# Patient Record
Sex: Female | Born: 1965 | Race: Black or African American | Hispanic: No | Marital: Single
Health system: Southern US, Community
[De-identification: ages and names within clinical notes are randomized; demographics above are authoritative.]

---

## 2014-08-19 ENCOUNTER — Encounter (HOSPITAL_COMMUNITY): Payer: Self-pay | Admitting: Emergency Medicine

## 2014-08-19 ENCOUNTER — Emergency Department (INDEPENDENT_AMBULATORY_CARE_PROVIDER_SITE_OTHER): Payer: Medicare Other

## 2014-08-19 ENCOUNTER — Emergency Department (INDEPENDENT_AMBULATORY_CARE_PROVIDER_SITE_OTHER)
Admission: EM | Admit: 2014-08-19 | Discharge: 2014-08-19 | Disposition: A | Discharge status: Home / Self Care | Payer: Medicare Other | Source: Home / Self Care | Attending: Emergency Medicine | Admitting: Emergency Medicine

## 2014-08-19 DIAGNOSIS — D5700 Hb-SS disease with crisis, unspecified: Secondary | ICD-10-CM

## 2014-08-19 LAB — POCT I-STAT, CHEM 8
BUN: 19 mg/dL (ref 6–20)
Calcium, Ion: 1.2 mmol/L (ref 1.12–1.23)
Chloride: 109 mmol/L (ref 101–111)
Creatinine, Ser: 1 mg/dL (ref 0.44–1.00)
Glucose, Bld: 99 mg/dL (ref 65–99)
HEMATOCRIT: 25 % — AB (ref 36.0–46.0)
Hemoglobin: 8.5 g/dL — ABNORMAL LOW (ref 12.0–15.0)
Potassium: 4.8 mmol/L (ref 3.5–5.1)
Sodium: 142 mmol/L (ref 135–145)
TCO2: 22 mmol/L (ref 0–100)

## 2014-08-19 NOTE — Discharge Instructions (Signed)
Your hemoglobin is 8.5 on the i-STAT. This means it is likely between 7 and 7.5. Your chest x-ray is normal. Please stay hydrated and use your pain medicine as prescribed. If you are not improving in the next few days, please go to the emergency room for additional evaluation.

## 2014-08-19 NOTE — ED Provider Notes (Signed)
CSN: [id number]     Arrival date & time 08/19/14  1840 History   None    Chief Complaint  Patient presents with  . Sickle Cell Pain Crisis   (Consider location/radiation/quality/duration/timing/severity/associated sxs/prior Treatment) HPI  She is a 49 year old woman with sickle cell disease here for pain crisis. She states this started about 2 days ago. She reports a lot of fatigue. She describes chest tightness, but denies any pain. She denies any shortness of breath. She does report a cough, but states it's due to a tickle in her throat. No fevers. She states her hemoglobin typically runs 7.5 or above.  History reviewed. No pertinent past medical history. No past surgical history on file. History reviewed. No pertinent family history. History  Substance Use Topics  . Smoking status: Not on file  . Smokeless tobacco: Not on file  . Alcohol Use: Not on file   OB History    No data available     Review of Systems As in history of present illness Allergies  Review of patient's allergies indicates no known allergies.  Home Medications   Prior to Admission medications   Not on File   BP 134/78 mmHg  Pulse 68  Temp(Src) 98.4 F (36.9 C) (Oral)  Resp 16  SpO2 100% Physical Exam  Constitutional: She is oriented to person, place, and time. She appears well-developed and well-nourished. No distress.  Neck: Neck supple.  Cardiovascular: Normal rate and regular rhythm.   Murmur (2/6 systolic) heard. Pulmonary/Chest: Effort normal and breath sounds normal. No respiratory distress. She has no wheezes. She has no rales. She exhibits no tenderness.  Neurological: She is alert and oriented to person, place, and time.    ED Course  Procedures (including critical care time) Labs Review Labs Reviewed  POCT I-STAT, CHEM 8 - Abnormal; Notable for the following:    Hemoglobin 8.5 (*)    HCT 25.0 (*)    All other components within normal limits    Imaging Review Dg Chest 2  View  08/19/2014   CLINICAL DATA:  49 year old female with chest tightness. History of sickle cell.  EXAM: CHEST  2 VIEW  COMPARISON:  None.  FINDINGS: Left-sided central venous catheter with tip over the right atrium. There is no focal consolidation, pleural effusion, or pneumothorax. There is mild increased interstitial and vascular prominence. The cardiomediastinal silhouette is within normal limits. The osseous structures are grossly unremarkable.  IMPRESSION: No active cardiopulmonary disease.   Electronically Signed   By: Elgie CollardArash  Radparvar M.D.   On: 08/19/2014 20:18     MDM   1. Sickle cell crisis    Hemoglobin is at baseline. No history or exam findings or x-ray findings concerning for acute chest. She states she has pain medicine at home. She wanted to make sure her hemoglobin was okay. She is stable for discharge. Recommended hydration and home use of her pain medicine. Follow-up in the emergency room if needed.    Charm RingsErin J Honig, MD 08/19/14 2027

## 2014-08-19 NOTE — ED Notes (Signed)
C/o sickle cell crisis

## 2016-01-21 IMAGING — DX DG CHEST 2V
2 series · 2 of 2 positions shown · non-contrast
Comparison: None.

CLINICAL DATA: 48-year-old female with chest tightness. History of
sickle cell.

EXAM:
CHEST  2 VIEW

[chest pa]
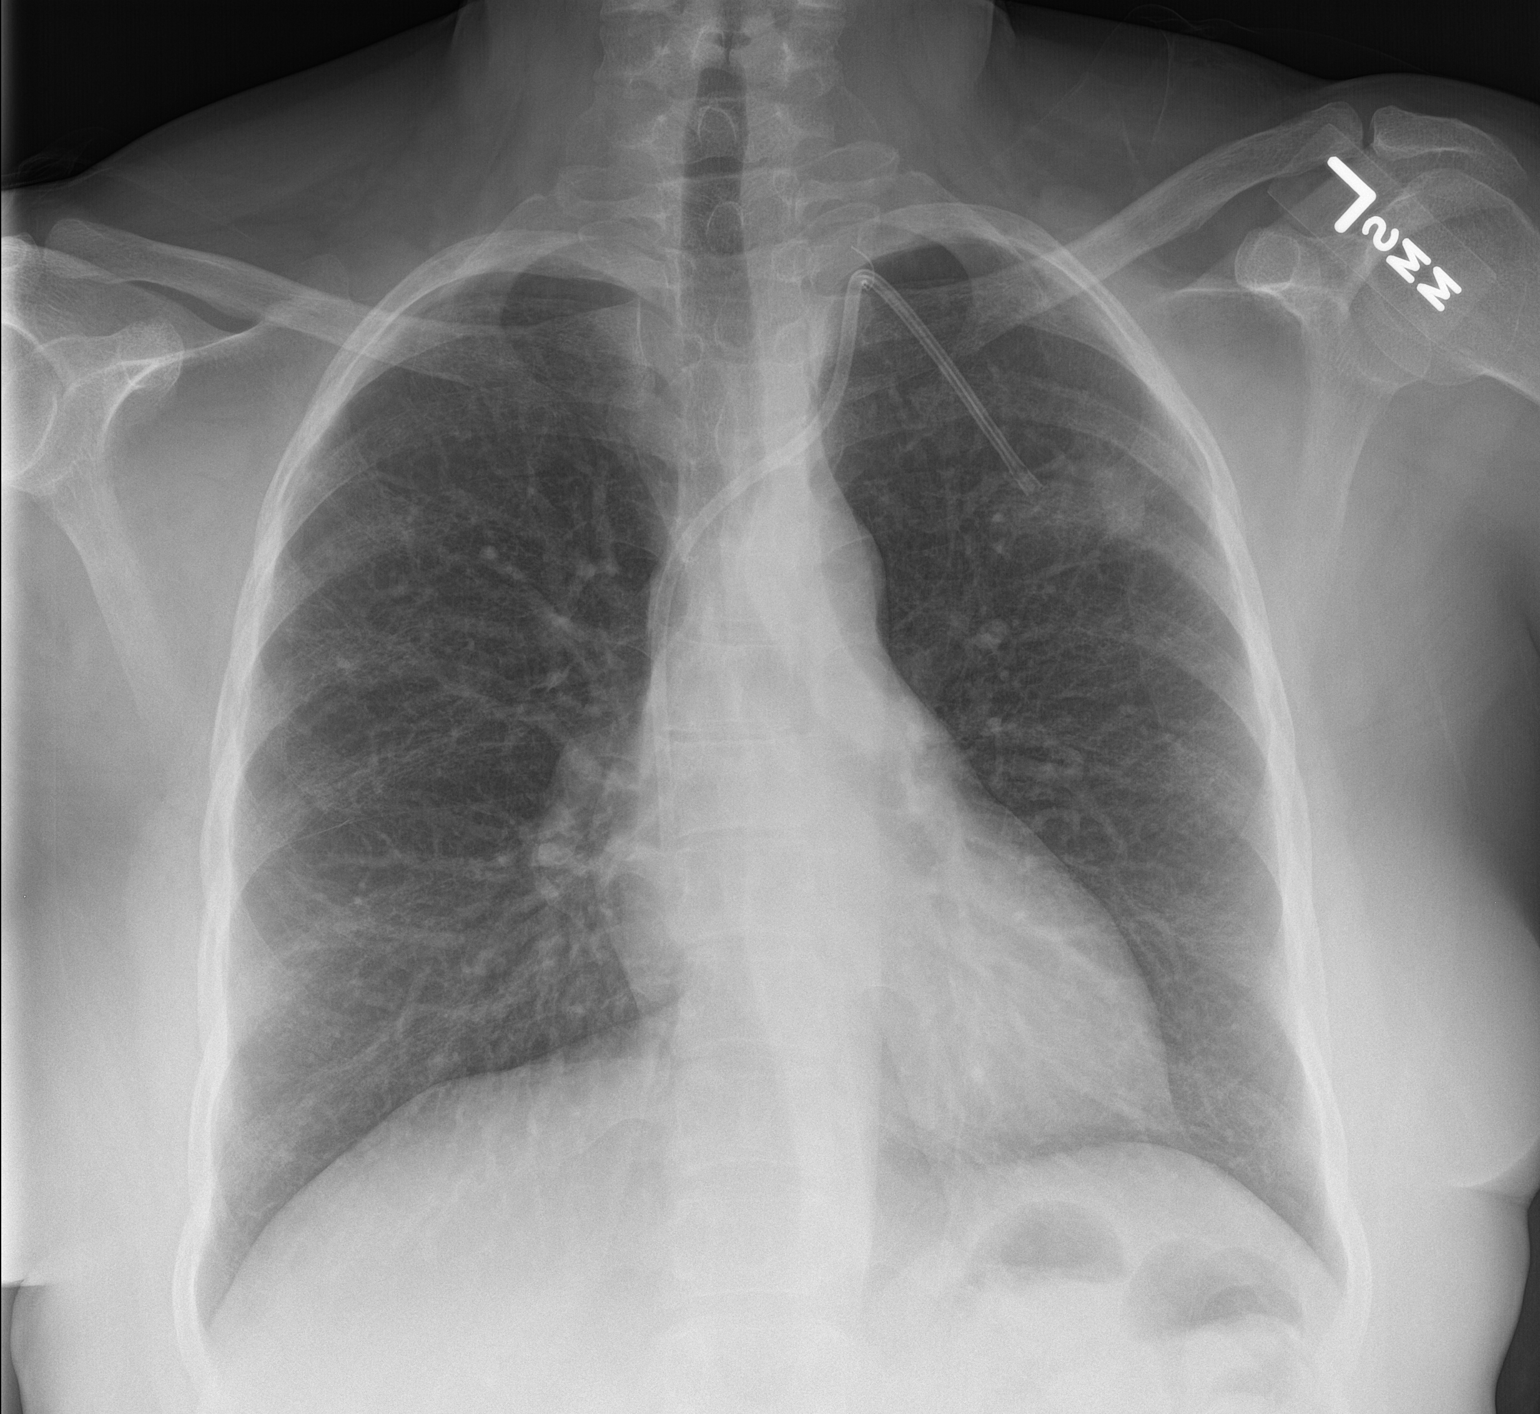

[chest lat]
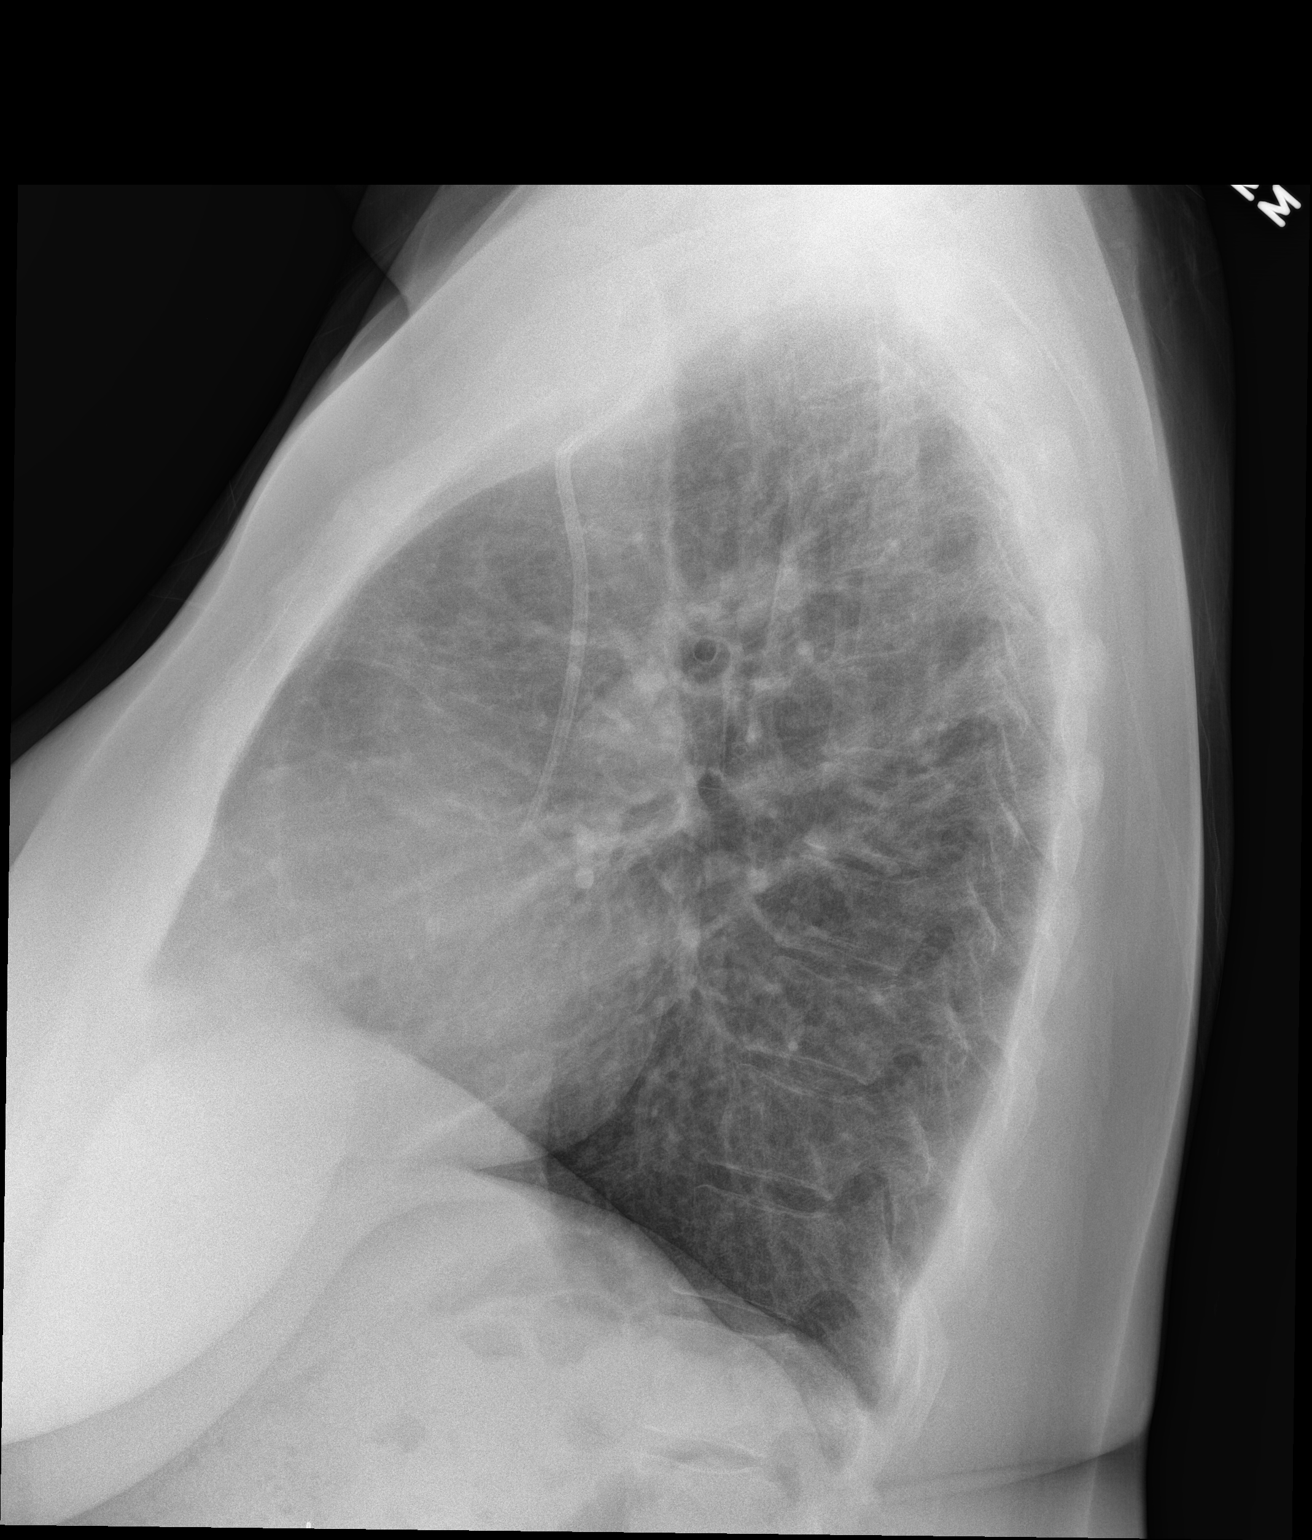

[2 of 2 positions shown; findings below may reference images not displayed]

FINDINGS: Left-sided central venous catheter with tip over the right atrium.
There is no focal consolidation, pleural effusion, or pneumothorax.
There is mild increased interstitial and vascular prominence. The
cardiomediastinal silhouette is within normal limits. The osseous
structures are grossly unremarkable.
IMPRESSION: No active cardiopulmonary disease.

## 2019-05-31 DEATH — deceased
